# Patient Record
Sex: Female | Born: 1985 | Race: White | Hispanic: No | Smoking: Never smoker
Health system: Southern US, Community
[De-identification: ages and names within clinical notes are randomized; demographics above are authoritative.]

## PROBLEM LIST (undated history)

## (undated) DIAGNOSIS — I10 Essential (primary) hypertension: Secondary | ICD-10-CM

## (undated) DIAGNOSIS — I499 Cardiac arrhythmia, unspecified: Secondary | ICD-10-CM

## (undated) HISTORY — PX: WISDOM TOOTH EXTRACTION: SHX21

## (undated) HISTORY — PX: FACIAL COSMETIC SURGERY: SHX629

## (undated) HISTORY — PX: TUBAL LIGATION: SHX77

## (undated) HISTORY — PX: DILATION AND EVACUATION: SHX1459

---

## 2013-08-29 ENCOUNTER — Encounter (HOSPITAL_COMMUNITY): Payer: Self-pay | Admitting: Emergency Medicine

## 2013-08-29 ENCOUNTER — Emergency Department (HOSPITAL_COMMUNITY): Payer: Medicaid Other

## 2013-08-29 ENCOUNTER — Emergency Department (HOSPITAL_COMMUNITY)
Admission: EM | Admit: 2013-08-29 | Discharge: 2013-08-29 | Disposition: A | Discharge status: Home / Self Care | Payer: Medicaid Other | Attending: Emergency Medicine | Admitting: Emergency Medicine

## 2013-08-29 DIAGNOSIS — L0201 Cutaneous abscess of face: Secondary | ICD-10-CM | POA: Insufficient documentation

## 2013-08-29 DIAGNOSIS — Z3202 Encounter for pregnancy test, result negative: Secondary | ICD-10-CM | POA: Insufficient documentation

## 2013-08-29 DIAGNOSIS — L03211 Cellulitis of face: Secondary | ICD-10-CM

## 2013-08-29 DIAGNOSIS — Z8679 Personal history of other diseases of the circulatory system: Secondary | ICD-10-CM | POA: Insufficient documentation

## 2013-08-29 HISTORY — DX: Cardiac arrhythmia, unspecified: I49.9

## 2013-08-29 LAB — POC URINE PREG, ED: Preg Test, Ur: NEGATIVE

## 2013-08-29 MED ORDER — OXYCODONE-ACETAMINOPHEN 5-325 MG PO TABS: 1.0000 | ORAL_TABLET | ORAL | Status: DC | PRN

## 2013-08-29 MED ORDER — FLUCONAZOLE 200 MG PO TABS: 200.0000 mg | ORAL_TABLET | Freq: Every day | ORAL | Status: AC

## 2013-08-29 MED ORDER — CLINDAMYCIN HCL 150 MG PO CAPS: 150.0000 mg | ORAL_CAPSULE | Freq: Four times a day (QID) | ORAL | Status: AC

## 2013-08-29 MED ORDER — CLINDAMYCIN PHOSPHATE 900 MG/50ML IV SOLN
900.0000 mg | Freq: Once | INTRAVENOUS | Status: AC
  Administered 2013-08-29: 900 mg via INTRAVENOUS
  Filled 2013-08-29: qty 50

## 2013-08-29 MED ORDER — KETOROLAC TROMETHAMINE 30 MG/ML IJ SOLN
30.0000 mg | Freq: Once | INTRAMUSCULAR | Status: AC
  Administered 2013-08-29: 30 mg via INTRAVENOUS
  Filled 2013-08-29: qty 1

## 2013-08-29 MED ORDER — IOHEXOL 300 MG/ML  SOLN
80.0000 mL | Freq: Once | INTRAMUSCULAR | Status: AC | PRN
  Administered 2013-08-29: 80 mL via INTRAVENOUS

## 2013-08-29 NOTE — ED Provider Notes (Signed)
CSN: [id number]     Arrival date & time 08/29/13  1802 History   First MD Initiated Contact with Patient 08/29/13 1942     Chief Complaint  Patient presents with  . Facial Swelling     (Consider location/radiation/quality/duration/timing/severity/associated sxs/prior Treatment) HPI Comments: Patient here with right mandibular facial swelling for the past day.  She states that she has not had any dental pain but called her dentist today because she thought this might be it - he suggested she come to the ED.  She reports pain to the swollen area but no drainage, fever, chills, she states that the pain radiates up to her right ear - she states that she has taken a percocet that she had at home without relief of the pain.  She describes the pain as throbbing in nature.  The history is provided by the patient. No language interpreter was used.    Past Medical History  Diagnosis Date  . Irregular heart beat    Past Surgical History  Procedure Laterality Date  . Cesarean section    . Wisdom tooth extraction    . Dilation and evacuation    . Facial cosmetic surgery    . Tubal ligation     No family history on file. History  Substance Use Topics  . Smoking status: Never Smoker   . Smokeless tobacco: Not on file  . Alcohol Use: Yes     Comment: occasional   OB History   Grav Para Term Preterm Abortions TAB SAB Ect Mult Living                 Review of Systems  HENT: Positive for facial swelling. Negative for dental problem and mouth sores.   All other systems reviewed and are negative.     Allergies  Review of patient's allergies indicates no known allergies.  Home Medications   Prior to Admission medications   Medication Sig Start Date End Date Taking? Authorizing Provider  acetaminophen (TYLENOL) 500 MG tablet Take 1,000 mg by mouth every 6 (six) hours as needed for mild pain or headache.   Yes Historical Provider, MD  ibuprofen (ADVIL,MOTRIN) 800 MG tablet Take 800 mg  by mouth every 8 (eight) hours as needed for fever, headache or moderate pain.   Yes Historical Provider, MD   BP 129/79  Pulse 72  Temp(Src) 98.5 F (36.9 C) (Oral)  Resp 18  SpO2 100%  LMP 08/14/2013 Physical Exam  Nursing note and vitals reviewed. Constitutional: She is oriented to person, place, and time. She appears well-developed and well-nourished. No distress.  HENT:  Head: Atraumatic.  Right Ear: External ear normal.  Left Ear: External ear normal.  Nose: Nose normal.  Mouth/Throat: Oropharynx is clear and moist. No oropharyngeal exudate.  4cm x 5 cm area of induration to right mandibular area without extension beyond the angle of the mandible.  No sublingual swelling, no dental pain.  Eyes: Conjunctivae are normal. Pupils are equal, round, and reactive to light. No scleral icterus.  Neck: Normal range of motion. Neck supple.  Cardiovascular: Normal rate, regular rhythm and normal heart sounds.  Exam reveals no gallop and no friction rub.   No murmur heard. Pulmonary/Chest: Effort normal and breath sounds normal. No respiratory distress. She has no wheezes. She has no rales. She exhibits no tenderness.  Abdominal: Soft. Bowel sounds are normal. She exhibits no distension. There is no tenderness. There is no rebound and no guarding.  Musculoskeletal: Normal range of  motion. She exhibits no edema and no tenderness.  Lymphadenopathy:    She has no cervical adenopathy.  Neurological: She is alert and oriented to person, place, and time. She exhibits normal muscle tone. Coordination normal.  Skin: Skin is warm and dry. No rash noted. No erythema. No pallor.  Psychiatric: She has a normal mood and affect. Her behavior is normal. Judgment and thought content normal.    ED Course  Procedures (including critical care time) Labs Review Labs Reviewed  POC URINE PREG, ED    Imaging Review Ct Maxillofacial W/cm  08/29/2013   CLINICAL DATA:  Right-sided facial swelling  EXAM: CT  MAXILLOFACIAL WITH CONTRAST  TECHNIQUE: Multidetector CT imaging of the maxillofacial structures was performed with intravenous contrast. Multiplanar CT image reconstructions were also generated. A small metallic BB was placed on the right temple in order to reliably differentiate right from left.  CONTRAST:  80mL OMNIPAQUE IOHEXOL 300 MG/ML  SOLN  COMPARISON:  None.  FINDINGS: Considerable soft tissue swelling is noted in the right face along the cheek and mandible. No focal fluid collection is identified to suggest focal abscess formation. The orbits and their contents are within normal limits. No other focal abnormality is seen. The visualized intracranial structures are unremarkable.  IMPRESSION: Soft tissue swelling in the right cheek and jaw without focal abscess formation. These changes would be of most consistent with cellulitis.  No bony abnormality is seen.   Electronically Signed   By: Alcide CleverMark  Lukens M.D.   On: 08/29/2013 21:12     EKG Interpretation None      Medications  clindamycin (CLEOCIN) IVPB 900 mg (0 mg Intravenous Stopped 08/29/13 2217)  iohexol (OMNIPAQUE) 300 MG/ML solution 80 mL (80 mLs Intravenous Contrast Given 08/29/13 2107)  ketorolac (TORADOL) 30 MG/ML injection 30 mg (30 mg Intravenous Given 08/29/13 2217)     MDM   Facial cellulitis  Patient here with right mandibular cellulitis - no abscess noted on CT scan - patient given IV dose of clindamycin and will place on this for the infection.  Patient to follow up with PCP.   Izola PriceFrances C. Marisue HumbleSanford, PA-C 08/29/13 2227

## 2013-08-29 NOTE — ED Notes (Signed)
Pt states that the swelling began around 3am and has progressively gotten worse. Pt states that she has taken Tylenol, Ibuprofen and Percocet at home with no relief.

## 2013-08-29 NOTE — Progress Notes (Signed)
  CARE MANAGEMENT ED NOTE 08/29/2013  Patient:  Samantha Choi,Samantha Choi   Account Number:  [id number]  Date Initiated:  08/29/2013  Documentation initiated by:  Radford PaxFERRERO,Darlene Bartelt  Subjective/Objective Assessment:   Patient presented to Ed with facial swelling     Subjective/Objective Assessment Detail:     Action/Plan:   Action/Plan Detail:   Anticipated DC Date:       Status Recommendation to Physician:   Result of Recommendation:    Other ED Services  Consult Working Plan    DC Planning Services  Other  PCP issues    Choice offered to / List presented to:            Status of service:  Completed, signed off  ED Comments:   ED Comments Detail:  EDCM spoke to patient at bedside.  Patient is aware she has [employer].  Patient reports she will call them to try to have it changed to regular Medicaid.  EDCM offered patient phone number to DSS, patient refused. Patient [facility] [address].  Patient reports she doe snot see anyone there in particular.  Sytem updated.  No further EDCM needs at this time.

## 2013-08-29 NOTE — ED Notes (Signed)
Pt states she woke up this morning with the R side of her face swollen. States she feels no dental involvement. Airway intact. Pt states it feels like a bump that got bigger. Alert and oriented.

## 2013-08-29 NOTE — Discharge Instructions (Signed)
Cellulitis Cellulitis is an infection of the skin and the tissue beneath it. The infected area is usually red and tender. Cellulitis occurs most often in the arms and lower legs.  CAUSES  Cellulitis is caused by bacteria that enter the skin through cracks or cuts in the skin. The most common types of bacteria that cause cellulitis are Staphylococcus and Streptococcus. SYMPTOMS   Redness and warmth.  Swelling.  Tenderness or pain.  Fever. DIAGNOSIS  Your caregiver can usually determine what is wrong based on a physical exam. Blood tests may also be done. TREATMENT  Treatment usually involves taking an antibiotic medicine. HOME CARE INSTRUCTIONS   Take your antibiotics as directed. Finish them even if you start to feel better.  Keep the infected arm or leg elevated to reduce swelling.  Apply a warm cloth to the affected area up to 4 times per day to relieve pain.  Only take over-the-counter or prescription medicines for pain, discomfort, or fever as directed by your caregiver.  Keep all follow-up appointments as directed by your caregiver. SEEK MEDICAL CARE IF:   You notice red streaks coming from the infected area.  Your red area gets larger or turns dark in color.  Your bone or joint underneath the infected area becomes painful after the skin has healed.  Your infection returns in the same area or another area.  You notice a swollen bump in the infected area.  You develop new symptoms. SEEK IMMEDIATE MEDICAL CARE IF:   You have a fever.  You feel very sleepy.  You develop vomiting or diarrhea.  You have a general ill feeling (malaise) with muscle aches and pains. MAKE SURE YOU:   Understand these instructions.  Will watch your condition.  Will get help right away if you are not doing well or get worse. Document Released: 01/15/2005 Document Revised: 10/07/2011 Document Reviewed: 06/23/2011 [facility] Patient Information 2014 [place], [place].  Facial  Infection You have an infection of your face. This requires special attention to help prevent serious problems. Infections in facial wounds can cause poor healing and scars. They can also spread to deeper tissues, especially around the eye. Wound and dental infections can lead to sinusitis, infection of the eye socket, and even meningitis. Permanent damage to the skin, eye, and nervous system may result if facial infections are not treated properly. With severe infections, hospital care for IV antibiotic injections may be needed if they don't respond to oral antibiotics. Antibiotics must be taken for the full course to insure the infection is eliminated. If the infection came from a bad tooth, it may have to be extracted when the infection is under control. Warm compresses may be applied to reduce skin irritation and remove drainage. You might need a tetanus shot now if:  You cannot remember when your last tetanus shot was.  You have never had a tetanus shot.  The object that caused your wound was dirty. If you need a tetanus shot, and you decide not to get one, there is a rare chance of getting tetanus. Sickness from tetanus can be serious. If you got a tetanus shot, your arm may swell, get red and warm to the touch at the shot site. This is common and not a problem. SEEK IMMEDIATE MEDICAL CARE IF:   You have increased swelling, redness, or trouble breathing.  You have a severe headache, dizziness, nausea, or vomiting.  You develop problems with your eyesight.  You have a fever. Document Released: 05/15/2004 Document Revised:  06/30/2011 Document Reviewed: 04/07/2005 [employer] Patient Information 2014 [place], [place].

## 2013-08-31 NOTE — ED Provider Notes (Signed)
Medical screening examination/treatment/procedure(s) were performed by non-physician practitioner and as supervising physician I was immediately available for consultation/collaboration.   EKG Interpretation None       Aarohi Redditt M Makenna Macaluso, MD 08/31/13 2124 

## 2014-09-18 ENCOUNTER — Encounter (HOSPITAL_COMMUNITY): Payer: Self-pay

## 2014-09-18 ENCOUNTER — Emergency Department (HOSPITAL_COMMUNITY)
Admission: EM | Admit: 2014-09-18 | Discharge: 2014-09-18 | Disposition: A | Discharge status: Home / Self Care | Payer: Self-pay | Attending: Emergency Medicine | Admitting: Emergency Medicine

## 2014-09-18 ENCOUNTER — Emergency Department (HOSPITAL_COMMUNITY): Payer: Self-pay

## 2014-09-18 ENCOUNTER — Emergency Department (HOSPITAL_COMMUNITY): Payer: Medicaid Other

## 2014-09-18 DIAGNOSIS — Z79899 Other long term (current) drug therapy: Secondary | ICD-10-CM | POA: Insufficient documentation

## 2014-09-18 DIAGNOSIS — R071 Chest pain on breathing: Secondary | ICD-10-CM

## 2014-09-18 DIAGNOSIS — N390 Urinary tract infection, site not specified: Secondary | ICD-10-CM | POA: Insufficient documentation

## 2014-09-18 DIAGNOSIS — I10 Essential (primary) hypertension: Secondary | ICD-10-CM | POA: Insufficient documentation

## 2014-09-18 DIAGNOSIS — K279 Peptic ulcer, site unspecified, unspecified as acute or chronic, without hemorrhage or perforation: Secondary | ICD-10-CM | POA: Insufficient documentation

## 2014-09-18 DIAGNOSIS — R0789 Other chest pain: Secondary | ICD-10-CM | POA: Insufficient documentation

## 2014-09-18 DIAGNOSIS — R079 Chest pain, unspecified: Secondary | ICD-10-CM

## 2014-09-18 DIAGNOSIS — Z9851 Tubal ligation status: Secondary | ICD-10-CM | POA: Insufficient documentation

## 2014-09-18 DIAGNOSIS — Z3202 Encounter for pregnancy test, result negative: Secondary | ICD-10-CM | POA: Insufficient documentation

## 2014-09-18 DIAGNOSIS — R0602 Shortness of breath: Secondary | ICD-10-CM | POA: Insufficient documentation

## 2014-09-18 DIAGNOSIS — Z9889 Other specified postprocedural states: Secondary | ICD-10-CM | POA: Insufficient documentation

## 2014-09-18 HISTORY — DX: Essential (primary) hypertension: I10

## 2014-09-18 LAB — CBC WITH DIFFERENTIAL/PLATELET
BASOS ABS: 0 10*3/uL (ref 0.0–0.1)
Basophils Relative: 0 % (ref 0–1)
EOS PCT: 2 % (ref 0–5)
Eosinophils Absolute: 0.1 10*3/uL (ref 0.0–0.7)
HCT: 42.7 % (ref 36.0–46.0)
Hemoglobin: 13.8 g/dL (ref 12.0–15.0)
Lymphocytes Relative: 40 % (ref 12–46)
Lymphs Abs: 2.7 10*3/uL (ref 0.7–4.0)
MCH: 26.8 pg (ref 26.0–34.0)
MCHC: 32.3 g/dL (ref 30.0–36.0)
MCV: 82.9 fL (ref 78.0–100.0)
Monocytes Absolute: 0.6 10*3/uL (ref 0.1–1.0)
Monocytes Relative: 9 % (ref 3–12)
NEUTROS ABS: 3.4 10*3/uL (ref 1.7–7.7)
NEUTROS PCT: 49 % (ref 43–77)
Platelets: 212 10*3/uL (ref 150–400)
RBC: 5.15 MIL/uL — ABNORMAL HIGH (ref 3.87–5.11)
RDW: 12.5 % (ref 11.5–15.5)
WBC: 6.9 10*3/uL (ref 4.0–10.5)

## 2014-09-18 LAB — COMPREHENSIVE METABOLIC PANEL
ALT: 11 U/L — AB (ref 14–54)
AST: 14 U/L — ABNORMAL LOW (ref 15–41)
Albumin: 3.9 g/dL (ref 3.5–5.0)
Alkaline Phosphatase: 81 U/L (ref 38–126)
Anion gap: 8 (ref 5–15)
BILIRUBIN TOTAL: 0.6 mg/dL (ref 0.3–1.2)
BUN: 11 mg/dL (ref 6–20)
CALCIUM: 8.9 mg/dL (ref 8.9–10.3)
CHLORIDE: 106 mmol/L (ref 101–111)
CO2: 26 mmol/L (ref 22–32)
CREATININE: 0.84 mg/dL (ref 0.44–1.00)
GFR calc Af Amer: >60 mL/min (ref >60)
GFR calc non Af Amer: >60 mL/min (ref >60)
GLUCOSE: 87 mg/dL (ref 65–99)
Potassium: 3.8 mmol/L (ref 3.5–5.1)
SODIUM: 140 mmol/L (ref 135–145)
Total Protein: 7.5 g/dL (ref 6.5–8.1)

## 2014-09-18 LAB — URINALYSIS, ROUTINE W REFLEX MICROSCOPIC
Bilirubin Urine: NEGATIVE
Glucose, UA: NEGATIVE mg/dL
KETONES UR: NEGATIVE mg/dL
Nitrite: POSITIVE — AB
PH: 7.5 (ref 5.0–8.0)
Protein, ur: NEGATIVE mg/dL
Specific Gravity, Urine: 1.023 (ref 1.005–1.030)
UROBILINOGEN UA: 1 mg/dL (ref 0.0–1.0)

## 2014-09-18 LAB — URINE MICROSCOPIC-ADD ON

## 2014-09-18 LAB — I-STAT TROPONIN, ED: TROPONIN I, POC: 0 ng/mL (ref 0.00–0.08)

## 2014-09-18 LAB — LIPASE, BLOOD: Lipase: 19 U/L — ABNORMAL LOW (ref 22–51)

## 2014-09-18 LAB — PREGNANCY, URINE: Preg Test, Ur: NEGATIVE

## 2014-09-18 MED ORDER — OMEPRAZOLE 40 MG PO CPDR: 40.0000 mg | DELAYED_RELEASE_CAPSULE | Freq: Every day | ORAL | Status: AC

## 2014-09-18 MED ORDER — HYDROMORPHONE HCL 1 MG/ML IJ SOLN
0.5000 mg | Freq: Once | INTRAMUSCULAR | Status: AC
  Administered 2014-09-18: 0.5 mg via INTRAVENOUS
  Filled 2014-09-18: qty 1

## 2014-09-18 MED ORDER — NITROFURANTOIN MONOHYD MACRO 100 MG PO CAPS: 100.0000 mg | ORAL_CAPSULE | Freq: Two times a day (BID) | ORAL | Status: AC

## 2014-09-18 MED ORDER — GI COCKTAIL ~~LOC~~
30.0000 mL | Freq: Once | ORAL | Status: AC
  Administered 2014-09-18: 30 mL via ORAL
  Filled 2014-09-18: qty 30

## 2014-09-18 MED ORDER — OXYCODONE-ACETAMINOPHEN 5-325 MG PO TABS: 1.0000 | ORAL_TABLET | Freq: Four times a day (QID) | ORAL | Status: AC | PRN

## 2014-09-18 MED ORDER — ONDANSETRON HCL 4 MG PO TABS: 4.0000 mg | ORAL_TABLET | Freq: Three times a day (TID) | ORAL | Status: AC | PRN

## 2014-09-18 MED ORDER — PANTOPRAZOLE SODIUM 40 MG IV SOLR
40.0000 mg | Freq: Once | INTRAVENOUS | Status: AC
  Administered 2014-09-18: 40 mg via INTRAVENOUS
  Filled 2014-09-18: qty 40

## 2014-09-18 MED ORDER — SODIUM CHLORIDE 0.9 % IV BOLUS (SEPSIS)
1000.0000 mL | Freq: Once | INTRAVENOUS | Status: AC
  Administered 2014-09-18: 1000 mL via INTRAVENOUS

## 2014-09-18 NOTE — Discharge Instructions (Signed)
Peptic Ulcer °A peptic ulcer is a sore in the lining of your esophagus (esophageal ulcer), stomach (gastric ulcer), or in the first part of your small intestine (duodenal ulcer). The ulcer causes erosion into the deeper tissue. °CAUSES  °Normally, the lining of the stomach and the small intestine protects itself from the acid that digests food. The protective lining can be damaged by: °· An infection caused by a bacterium called Helicobacter pylori (H. pylori). °· Regular use of nonsteroidal anti-inflammatory drugs (NSAIDs), such as ibuprofen or aspirin. °· Smoking tobacco. °Other risk factors include being older than 50, drinking alcohol excessively, and having a family history of ulcer disease.  °SYMPTOMS  °· Burning pain or gnawing in the area between the chest and the belly button. °· Heartburn. °· Nausea and vomiting. °· Bloating. °The pain can be worse on an empty stomach and at night. If the ulcer results in bleeding, it can cause: °· Black, tarry stools. °· Vomiting of bright red blood. °· Vomiting of coffee-ground-looking materials. °DIAGNOSIS  °A diagnosis is usually made based upon your history and an exam. Other tests and procedures may be performed to find the cause of the ulcer. Finding a cause will help determine the best treatment. Tests and procedures may include: °· Blood tests, stool tests, or breath tests to check for the bacterium H. pylori. °· An upper gastrointestinal (GI) series of the esophagus, stomach, and small intestine. °· An endoscopy to examine the esophagus, stomach, and small intestine. °· A biopsy. °TREATMENT  °Treatment may include: °· Eliminating the cause of the ulcer, such as smoking, NSAIDs, or alcohol. °· Medicines to reduce the amount of acid in your digestive tract. °· Antibiotic medicines if the ulcer is caused by the H. pylori bacterium. °· An upper endoscopy to treat a bleeding ulcer. °· Surgery if the bleeding is severe or if the ulcer created a hole somewhere in the  digestive system. °HOME CARE INSTRUCTIONS  °· Avoid tobacco, alcohol, and caffeine. Smoking can increase the acid in the stomach, and continued smoking will impair the healing of ulcers. °· Avoid foods and drinks that seem to cause discomfort or aggravate your ulcer. °· Only take medicines as directed by your caregiver. Do not substitute over-the-counter medicines for prescription medicines without talking to your caregiver. °· Keep any follow-up appointments and tests as directed. °SEEK MEDICAL CARE IF:  °· Your do not improve within 7 days of starting treatment. °· You have ongoing indigestion or heartburn. °SEEK IMMEDIATE MEDICAL CARE IF:  °· You have sudden, sharp, or persistent abdominal pain. °· You have bloody or dark black, tarry stools. °· You vomit blood or vomit that looks like coffee grounds. °· You become light-headed, weak, or feel faint. °· You become sweaty or clammy. °MAKE SURE YOU:  °· Understand these instructions. °· Will watch your condition. °· Will get help right away if you are not doing well or get worse. °Document Released: 04/04/2000 Document Revised: 08/22/2013 Document Reviewed: 11/05/2011 °ExitCare® Patient Information ©2015 ExitCare, LLC. This information is not intended to replace advice given to you by your health care provider. Make sure you discuss any questions you have with your health care provider. ° °Food Choices for Peptic Ulcer Disease °When you have peptic ulcer disease, the foods you eat and your eating habits are very important. Choosing the right foods can help ease the discomfort of peptic ulcer disease. °WHAT GENERAL GUIDELINES DO I NEED TO FOLLOW? °· Choose fruits, vegetables, whole grains, and low-fat meat,   fish, and poultry.   Keep a food diary to identify foods that cause symptoms.  Avoid foods that cause irritation or pain. These may be different for different people.  Eat frequent small meals instead of three large meals each day. The pain may be worse  when your stomach is empty.  Avoid eating close to bedtime. WHAT FOODS ARE NOT RECOMMENDED? The following are some foods and drinks that may worsen your symptoms:  Black, white, and red pepper.  Hot sauce.  Chili peppers.  Chili powder.  Chocolate and cocoa.   Alcohol.  Tea, coffee, and cola (regular and decaffeinated). The items listed above may not be a complete list of foods and beverages to avoid. Contact your dietitian for more information. Document Released: 06/30/2011 Document Revised: 04/12/2013 Document Reviewed: 02/09/2013 [facility] Patient Information 2015 [place]. This information is not intended to replace advice given to you by your health care provider. Make sure you discuss any questions you have with your health care provider.  Urinary Tract Infection Urinary tract infections (UTIs) can develop anywhere along your urinary tract. Your urinary tract is your body's drainage system for removing wastes and extra water. Your urinary tract includes two kidneys, two ureters, a bladder, and a urethra. Your kidneys are a pair of bean-shaped organs. Each kidney is about the size of your fist. They are located below your ribs, one on each side of your spine. CAUSES Infections are caused by microbes, which are microscopic organisms, including fungi, viruses, and bacteria. These organisms are so small that they can only be seen through a microscope. Bacteria are the microbes that most commonly cause UTIs. SYMPTOMS  Symptoms of UTIs may vary by age and gender of the patient and by the location of the infection. Symptoms in young women typically include a frequent and intense urge to urinate and a painful, burning feeling in the bladder or urethra during urination. Older women and men are more likely to be tired, shaky, and weak and have muscle aches and abdominal pain. A fever may mean the infection is in your kidneys. Other symptoms of a kidney infection include pain in your  back or sides below the ribs, nausea, and vomiting. DIAGNOSIS To diagnose a UTI, your caregiver will ask you about your symptoms. Your caregiver also will ask to provide a urine sample. The urine sample will be tested for bacteria and white blood cells. White blood cells are made by your body to help fight infection. TREATMENT  Typically, UTIs can be treated with medication. Because most UTIs are caused by a bacterial infection, they usually can be treated with the use of antibiotics. The choice of antibiotic and length of treatment depend on your symptoms and the type of bacteria causing your infection. HOME CARE INSTRUCTIONS  If you were prescribed antibiotics, take them exactly as your caregiver instructs you. Finish the medication even if you feel better after you have only taken some of the medication.  Drink enough water and fluids to keep your urine clear or pale yellow.  Avoid caffeine, tea, and carbonated beverages. They tend to irritate your bladder.  Empty your bladder often. Avoid holding urine for long periods of time.  Empty your bladder before and after sexual intercourse.  After a bowel movement, women should cleanse from front to back. Use each tissue only once. SEEK MEDICAL CARE IF:   You have back pain.  You develop a fever.  Your symptoms do not begin to resolve within 3 days. SEEK  IMMEDIATE MEDICAL CARE IF:   You have severe back pain or lower abdominal pain.  You develop chills.  You have nausea or vomiting.  You have continued burning or discomfort with urination. MAKE SURE YOU:   Understand these instructions.  Will watch your condition.  Will get help right away if you are not doing well or get worse. Document Released: 01/15/2005 Document Revised: 10/07/2011 Document Reviewed: 05/16/2011 [facility] Patient Information 2015 [place], [place]. This information is not intended to replace advice given to you by your health care provider. Make sure you  discuss any questions you have with your health care provider.   Emergency Department Resource Guide 1) Find a Doctor and Pay Out of Pocket Although you won't have to find out who is covered by your insurance plan, it is a good idea to ask around and get recommendations. You will then need to call the office and see if the doctor you have chosen will accept you as a new patient and what types of options they offer for patients who are self-pay. Some doctors offer discounts or will set up payment plans for their patients who do not have insurance, but you will need to ask so you aren't surprised when you get to your appointment.  2) Contact Your Local Health Department Not all health departments have doctors that can see patients for sick visits, but many do, so it is worth a call to see if yours does. If you don't know where your local health department is, you can check in your phone book. The CDC also has a tool to help you locate your state's health department, and many state websites also have listings of all of their local health departments.  3) Find a Walk-in Clinic If your illness is not likely to be very severe or complicated, you may want to try a walk in clinic. These are popping up all over the country in pharmacies, drugstores, and shopping centers. They're usually staffed by nurse practitioners or physician assistants that have been trained to treat common illnesses and complaints. They're usually fairly quick and inexpensive. However, if you have serious medical issues or chronic medical problems, these are probably not your best option.  No Primary Care Doctor: - Call Health Connect at  [phone] - they can help you locate a primary care doctor that  accepts your insurance, provides certain services, etc. - Physician Referral Service- [phone]  Chronic Pain Problems: Organization         Address  Phone   Notes  [facility]  [phone] Patients need to  be referred by their primary care doctor.   Medication Assistance: Organization         Address  Phone   Notes  [facility] [address] [place] [place], [place] [address] [phone] --Must be a resident of [facility] -- Must have NO insurance coverage whatsoever (no Medicaid/ Medicare, etc.) -- The pt. MUST have a primary care doctor that directs their care regularly and follows them in the community   [facility]ist  [phone]   [employer]  [phone]    Agencies that provide inexpensive medical care: Organization         Address  Phone   Notes  [facility]  [phone]   [facility]    [phone]   [facility] [address] [place], [place] [address] [phone]  [facility] [address], [place] [phone]   Planned Parenthood    [phone]   [facility]    [phone]   [facility]  [address], [place] Phone:  [phone], Fax:  [phone] Hours of Operation:  9 am - 6 pm, M-F.  Also accepts Medicaid/Medicare and self-pay.  [facility]  [address], [place] Phone: [phone], Fax: [phone]. Hours of Operation:  8:30 am - 5:30 pm, M-F.  Also accepts Medicaid and self-pay.  [facility] [place] [address] [place] Phone: [phone]   [facility] [address] Natasha Bence [place], [place] [phone], Ext. [phone] Mondays & Thursdays: 7-9 AM.  First 15 patients are seen on a first come, first serve basis.    Medicaid-accepting [facility] Providers:  Organization         Address  Phone   Notes  [facility] [address], [place] [phone] Also accepts self-pay patients.  [facility] [address] Laurell Josephs [place], [place]  [phone]   [facility] [address], [place] [phone]   [facility] [address] [place] [phone]   Renaye Rakers [address], [place]   [phone] Only accepts [place] patients after they have their name applied to their card.   Self-Pay (no insurance) in [facility]:  Organization         Address  Phone   Notes  Sickle Cell Patients, [facility] [address] [place], [place] [phone]   [facility] [address] [place], [place] [phone]   [facility] [place]  [address], [place] [phone]   [facility]/Dr. Osei-Bonsu  [address] [place] or [address], [place] [phone] Phone number for both [place] and [place] locations is the same.  [facility] [address], [place] [phone]   [facility] [address], [place] or [address] [phone]   [facility] [address], [place] [phone], phone; [phone], fax Sees patients 1st and 3rd Saturday of every month.  Must not qualify for public or private insurance (i.e. Medicaid, Medicare, [place] Health Choice, Veterans' Benefits)  Household income should be no more than 200% of the poverty level The clinic cannot treat you if you are pregnant or think you are pregnant  Sexually transmitted diseases are not treated at the clinic.    Dental Care: Organization         Address  Phone  Notes  [facility] [address] [place], [place] [phone] Accepts children up to age 62 who are enrolled in [place] or [place] Health Choice; pregnant women with a Medicaid card; and children who have applied for Medicaid or [place] Health Choice, but were declined, whose parents can  pay a reduced fee at time of service.  [facility]  [address], [employer] [facility] [phone] Accepts children up to age 98 who are enrolled in [place] or [place] Health Choice; pregnant women with a Medicaid card; and  children who have applied for Medicaid or [place] Health Choice, but were declined, whose parents can pay a reduced fee at time of service.  [facility]  [address] [place], [place] [phone] Patients are seen by appointment only. Walk-ins are not accepted. [facility] will see patients 31 years of age and older. Monday - Tuesday (8am-5pm) Most Wednesdays (8:30-5pm) $30 per visit, cash only  [facility] Adult Dental Access PRO[facility]  [address], [facility] [phone] Patients are seen by appointment only. Walk-ins are not accepted. [facility] will see patients 12 years of age and older. One Wednesday Evening (Monthly: Volunteer Based).  $30 per visit, cash only  [employer]  [phone] for adults; Children under age 73, call Graduate Pediatric Dentistry at [phone]. Children aged 59-14, please call [phone] to request a pediatric application.  Dental services are provided in all areas of dental care including fillings, crowns and bridges, complete and partial dentures, implants, gum treatment, root canals, and extractions. Preventive care is also provided. Treatment is provided to both adults and children. Patients are selected via a lottery and there is often a waiting list.   [facility] [address] [place]  [phone] [email or web address]   [facility] [address] [place], [place] [phone], Ext. [phone] Second and Fourth Thursday of each month, opens at 6:30 AM; Clinic ends at 9 AM.  Patients are seen on a first-come first-served basis, and a limited number are seen during each clinic.   [facility]  [address] Ether Griffins [place], [place] [phone]   Eligibility Requirements You must have lived in [place], [place], or [place] counties for at least the last three months.   You cannot be eligible for state or federal sponsored [place], including [employer], [place], or [employer].   You generally cannot be eligible for healthcare insurance through your employer.    How to apply: Eligibility screenings are held every Tuesday and Wednesday afternoon from 1:00 pm until 4:00 pm. You do not need an appointment for the interview!  [facility] [address] [place], [place] [phone]   [facility] Health Department  [phone]   [facility] Health Department  [phone]   [facility] Health Department  [phone]    Behavioral Health Resources in the Community: Intensive Outpatient Programs Organization         Address  Phone  Notes  [facility] Services [address] [place], [place] [phone]   [facility] [address] [place], [place] [phone]   [facility] [address] [place], [place]  [phone]   [facility] [address],  [place], [place] [phone] or [phone]   Substance Abuse Resources Organization         Address  Phone  Notes  [facility]  [phone]   [facility]  [phone]   [facility]  [phone]   Floydene Flock  [phone]   Residential & Outpatient Substance Abuse Program  [phone]   Psychological Services Organization         Address  Phone  Notes  [facility]  [phone]   [facility]  [phone]   [facility] Mental Health [address] [place] [phone] or [phone]    Mobile Crisis Teams Organization  Address  Phone  Notes  [facility], Mobile Crisis Care Unit  [phone]    [facility]  [address] [place], [place] [phone]   [facility] De[facility]sch [address] [place] [phone]    Self-Help/Support Groups Organization         Address  Phone             Notes  [facility] [place] - variety of support groups  [phone]- [phone] Call for more information  Narcotics Anonymous (NA), [facility] [address], [place] Reader  2 meetings at this location   Statisticianesidential Treatment Programs Organization         Address  Phone  Notes  [facility] [address],    [place]  [phone]   [facility]  [address], [place] [phone], [place], [place] [phone]   [facility] [address], [place] [phone] Admissions: 8am-3pm M-F  [facility] [address]    [place], [place] [phone]   [facility] [address], [place], [place] [phone]   [facility] [address]  [place], [place] [phone]   [facility] - Intensive Outpatient [address], [place], [place] [phone]   [facility] ([facility] [address]  [place], [place] [phone] or [phone]   [facility] ([facility]) [address] [place], [place] [phone] Accepts Medicaid  [facility] [address]  [place] [phone] Substance Abuse/Addiction Treatment   [facility] Resources Organization         Address  Phone  Notes  [facility]  [phone]) [phone]   Angie FavaJulie Brannon, PhD [address] [place], [place]   [phone] or [phone]   [facility]   [address] [place], [place] [phone]   [facility] [address], [place], [place] [phone]) [phone] Insurance/Medicaid/sponsorship through [facility]  [facility] [address]                                     [place], [place] [phone]) [phone] Therapy/tele-psych/case  [facility] [address]   [place], [place] [phone]    Dr. Lolly MustacheArfeen  [phone]) [phone]   [facility]  [employer] [facility] 1) [address], [place] 2) [address], [place] 3)  [address], [place] [phone]) [phone]) [phone]  [phone]) [phone]   [facility] Child Abuse Hotline [phone]) 3086653520 or [phone]) [phone] (After Hours)

## 2014-09-18 NOTE — ED Notes (Signed)
Patient transported to X-ray 

## 2014-09-18 NOTE — ED Provider Notes (Signed)
CSN: [id number]     Arrival date & time 09/18/14  1100 History   First MD Initiated Contact with Patient 09/18/14 1119     Chief Complaint  Patient presents with  . Chest Pain     (Consider location/radiation/quality/duration/timing/severity/associated sxs/prior Treatment) HPI Samantha Choi is a 29 year old female past medical history of hypertension who presents the ER complaining of chest discomfort. Patient reports since 2012 she has been experiencing intermittent chest discomfort, recently seen and this past January, 5 months ago for same. Patient was seen by cardiologist, seen by gastroenterologist, underwent EGD with diagnosis of H. pylori gastric ulcer. Patient states she is placed on several medications afterward, however is not compliant with these medications. Patient states she continues to get intermittent pains in her chest, which typically are tolerable. Patient reports having a pain since yesterday which has been intermittent. Last night she had the same pain which woke her from sleep. She states that this pain was consistent with pain she has been having since January, however was more severe. Patient reports associated nausea, shortness of breath. Patient states the pain has been intermittent since yesterday, and worsening. Patient states the pain comes for approximately 15 minutes of time, has no aggravating factors. Sitting up seems to have some alleviation of the pain. She denies having any exertional dyspnea or chest pain. She denies any dizziness, weakness, headache, blurred vision, palpitations, vomiting, fever.  Past Medical History  Diagnosis Date  . Irregular heart beat   . Hypertension    Past Surgical History  Procedure Laterality Date  . Cesarean section    . Wisdom tooth extraction    . Dilation and evacuation    . Facial cosmetic surgery    . Tubal ligation     History reviewed. No pertinent family history. History  Substance Use Topics  . Smoking  status: Never Smoker   . Smokeless tobacco: Not on file  . Alcohol Use: Yes     Comment: occasional   OB History    No data available     Review of Systems  Constitutional: Negative for fever.  HENT: Negative for trouble swallowing.   Eyes: Negative for visual disturbance.  Respiratory: Positive for shortness of breath. Negative for cough.   Cardiovascular: Negative for chest pain.  Gastrointestinal: Positive for nausea and abdominal pain. Negative for vomiting.  Genitourinary: Negative for dysuria.  Musculoskeletal: Negative for neck pain.  Skin: Negative for rash.  Neurological: Negative for dizziness, weakness and numbness.  Psychiatric/Behavioral: Negative.     Allergies  Review of patient's allergies indicates no known allergies.  Home Medications   Prior to Admission medications   Medication Sig Start Date End Date Taking? Authorizing Provider  acetaminophen (TYLENOL) 500 MG tablet Take 1,000 mg by mouth every 6 (six) hours as needed for mild pain or headache.   Yes Historical Provider, MD  ibuprofen (ADVIL,MOTRIN) 800 MG tablet Take 800 mg by mouth every 8 (eight) hours as needed for fever, headache or moderate pain.   Yes Historical Provider, MD  clindamycin (CLEOCIN) 150 MG capsule Take 1 capsule (150 mg total) by mouth every 6 (six) hours. Patient not taking: Reported on 09/18/2014 08/29/13   Cherrie Distance, PA-C  nitrofurantoin, macrocrystal-monohydrate, (MACROBID) 100 MG capsule Take 1 capsule (100 mg total) by mouth 2 (two) times daily. For 5 days. 09/18/14   Ladona Mow, PA-C  omeprazole (PRILOSEC) 40 MG capsule Take 1 capsule (40 mg total) by mouth daily. 09/18/14   Ladona Mow, PA-C  ondansetron (ZOFRAN) 4 MG tablet Take 1 tablet (4 mg total) by mouth every 8 (eight) hours as needed for nausea or vomiting. 09/18/14   Ladona Mow, PA-C  oxyCODONE-acetaminophen (PERCOCET) 5-325 MG per tablet Take 1-2 tablets by mouth every 6 (six) hours as needed. 09/18/14   Ladona Mow, PA-C    BP 102/59 mmHg  Pulse 62  Temp(Src) 98.2 F (36.8 C) (Oral)  Resp 16  SpO2 100%  LMP 09/17/2014 Physical Exam  Constitutional: She is oriented to person, place, and time. She appears well-developed and well-nourished. No distress.  HENT:  Head: Normocephalic and atraumatic.  Mouth/Throat: Oropharynx is clear and moist. No oropharyngeal exudate.  Eyes: Right eye exhibits no discharge. Left eye exhibits no discharge. No scleral icterus.  Neck: Normal range of motion.  Cardiovascular: Normal rate, regular rhythm and normal heart sounds.   No murmur heard. Pulmonary/Chest: Effort normal and breath sounds normal. No respiratory distress.  Abdominal: Soft. Normal appearance. There is tenderness in the epigastric area and left upper quadrant. There is no rigidity, no guarding, no tenderness at McBurney's point and negative Murphy's sign.  Musculoskeletal: Normal range of motion. She exhibits no edema or tenderness.  Neurological: She is alert and oriented to person, place, and time. No cranial nerve deficit. Coordination normal.  Skin: Skin is warm and dry. No rash noted. She is not diaphoretic.  Psychiatric: She has a normal mood and affect.  Nursing note and vitals reviewed.   ED Course  Procedures (including critical care time) Labs Review Labs Reviewed  CBC WITH DIFFERENTIAL/PLATELET - Abnormal; Notable for the following:    RBC 5.15 (*)    All other components within normal limits  COMPREHENSIVE METABOLIC PANEL - Abnormal; Notable for the following:    AST 14 (*)    ALT 11 (*)    All other components within normal limits  LIPASE, BLOOD - Abnormal; Notable for the following:    Lipase 19 (*)    All other components within normal limits  URINALYSIS, ROUTINE W REFLEX MICROSCOPIC (NOT AT [facility]) - Abnormal; Notable for the following:    APPearance CLOUDY (*)    Hgb urine dipstick SMALL (*)    Nitrite POSITIVE (*)    Leukocytes, UA SMALL (*)    All other components within  normal limits  URINE MICROSCOPIC-ADD ON - Abnormal; Notable for the following:    Bacteria, UA MANY (*)    All other components within normal limits  URINE CULTURE  PREGNANCY, URINE  I-STAT TROPOININ, ED    Imaging Review Dg Chest 2 View  09/18/2014   CLINICAL DATA:  Chest pain and shortness of breath for 1 day.  EXAM: CHEST  2 VIEW  COMPARISON:  PA and lateral chest 03/01/2013.  FINDINGS: Heart size and mediastinal contours are within normal limits. Both lungs are clear. Visualized skeletal structures are unremarkable.  IMPRESSION: Negative exam.   Electronically Signed   By: Drusilla Kanner M.D.   On: 09/18/2014 13:50     EKG Interpretation   Date/Time:  Monday Sep 18 2014 11:08:16 EDT Ventricular Rate:  65 PR Interval:  121 QRS Duration: 87 QT Interval:  421 QTC Calculation: 438 R Axis:   73 Text Interpretation:  Sinus rhythm No old tracing to compare Confirmed by  Mirian Mo [phone]) on 09/18/2014 2:50:08 PM      MDM   Final diagnoses:  Atypical chest pain  PUD (peptic ulcer disease)  UTI (lower urinary tract infection)    Patient is  to be discharged with recommendation to follow up with PCP and gastroenterologist in regards to today's hospital visit. Chest pain is not likely of cardiac or pulmonary etiology d/t presentation, perc negative, VSS, no tracheal deviation, no JVD or new murmur, RRR, breath sounds equal bilaterally, EKG without acute abnormalities, negative troponin, and negative CXR. HEART Score <3. Patient's pain is reproducible in her upper abdomen, states her pain is consistent with pain she has been experiencing for the past 6 months after being diagnosed with peptic ulcer. Patient states she has been noncompliant with peptic ulcer disease regimen she was placed on by her gastroenterologist. Pt has been advised start a PPI and return to the ED is CP becomes exertional, associated with diaphoresis or nausea, radiates to left jaw/arm, worsens or becomes  concerning in any way. Pt appears reliable for follow up and is agreeable to discharge. Patient was also noted to have a urinary tract infection. After questioning patient again she does endorse having some burning sensation which urinates some mild frequency. Return precautions discussed, patient verbalizes understanding and agreement of this plan. .  BP 102/59 mmHg  Pulse 62  Temp(Src) 98.2 F (36.8 C) (Oral)  Resp 16  SpO2 100%  LMP 09/17/2014  Signed,  Ladona MowJoe Mintz, PA-C 4:43 PM  Patient discussed with Dr. Mirian MoMatthew Gentry, MD    Ladona MowJoe Mintz, PA-C 09/18/14 1643  Medical screening examination/treatment/procedure(s) were performed by non-physician practitioner and as supervising physician I was immediately available for consultation/collaboration.   EKG Interpretation   Date/Time:  Monday Sep 18 2014 11:08:16 EDT Ventricular Rate:  65 PR Interval:  121 QRS Duration: 87 QT Interval:  421 QTC Calculation: 438 R Axis:   73 Text Interpretation:  Sinus rhythm No old tracing to compare Confirmed by  Mirian MoGentry, Matthew [phone]) on 09/18/2014 2:50:08 PM        Mirian MoMatthew Gentry, MD 09/22/14 1739

## 2014-09-18 NOTE — ED Notes (Signed)
Pt c/o central chest pain radiating into upper back, SOB, and nausea x 1 day.  Pain score 8/10.  Pt reports Hx of same and Cardiologist placed her on medication.  Sts "I haven't taken it in a long time, because it's expensive."  Hx of HTN.

## 2014-09-18 NOTE — ED Notes (Signed)
Nurse currently drawing labs 

## 2014-09-20 LAB — URINE CULTURE

## 2014-09-21 ENCOUNTER — Telehealth (HOSPITAL_BASED_OUTPATIENT_CLINIC_OR_DEPARTMENT_OTHER): Payer: Self-pay | Admitting: Emergency Medicine

## 2014-09-21 NOTE — Telephone Encounter (Signed)
Post ED Visit - Positive Culture Follow-up  Culture report reviewed by antimicrobial stewardship pharmacist: []  Wes Dulaney, Pharm.D., BCPS []  Celedonio MiyamotoJeremy Frens, Pharm.D., BCPS [x]  Georgina PillionElizabeth Martin, Pharm.D., BCPS []  AllisonMinh Pham, [place]arm[place]D., BCPS, AAHIVP []  Estella HuskMichelle Turner, Pharm.D., BCPS, AAHIVP []  Elder CyphersLorie Poole, [address]Pharm[address]D., BCPS  Positive urine  Culture Klebsiella Treated with nitrofurantoin, organism sensitive to the same and no further patient follow-up is required at this time.  Berle MullMiller, [place] Dorgan 09/21/2014, 1:30 PM

## 2015-05-12 IMAGING — CT CT MAXILLOFACIAL W/ CM
1 series · 16 of 30 positions shown, 20 images · IV contrast (omnipaque)
Comparison: None.

CLINICAL DATA: Right-sided facial swelling

EXAM:
CT MAXILLOFACIAL WITH CONTRAST
TECHNIQUE: Multidetector CT imaging of the maxillofacial structures was
performed with intravenous contrast. Multiplanar CT image
reconstructions were also generated. A small metallic BB was placed
on the right temple in order to reliably differentiate right from
left.
CONTRAST:  80mL OMNIPAQUE IOHEXOL 300 MG/ML  SOLN

[Series 3: facial st · axial · 0.33mm/px · z∈[-342,-188]mm · 16 of 83 slices shown, 20 images]
[im 3/83  brain]
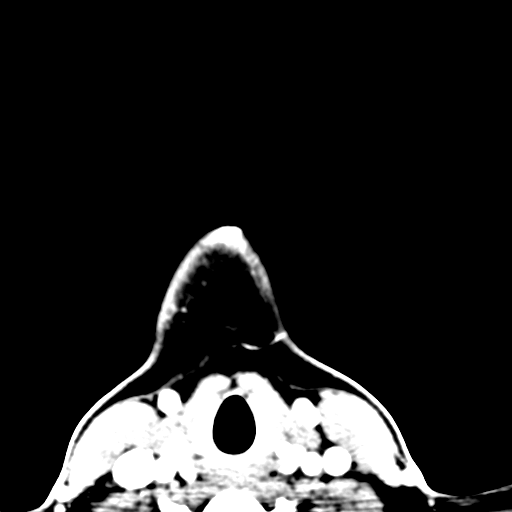
[im 3/83  bone]
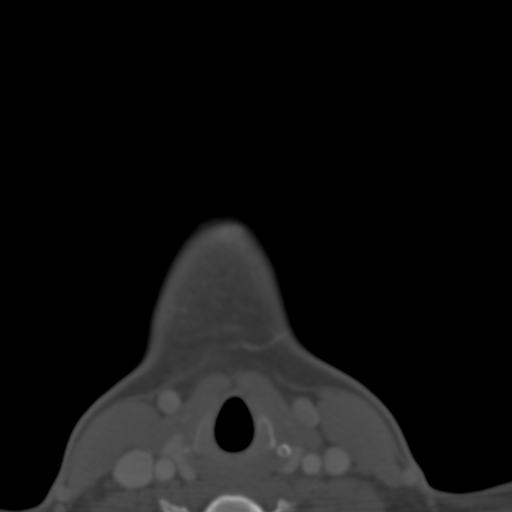
[im 9/83  bone]
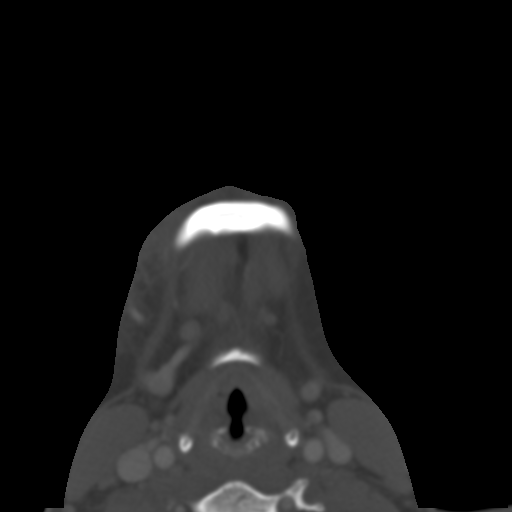
[im 15/83  bone]
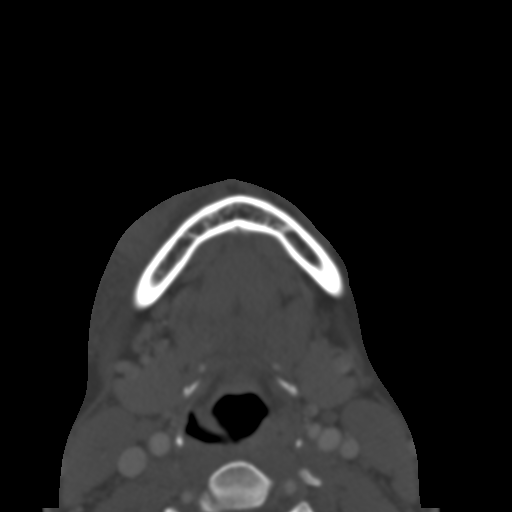
[im 20/83  bone]
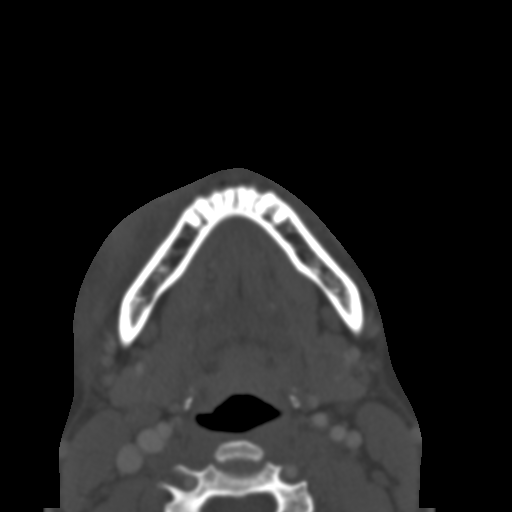
[im 23/83  brain]
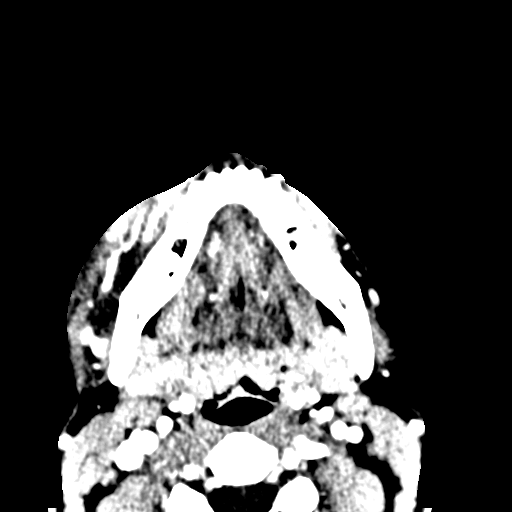
[im 23/83  bone]
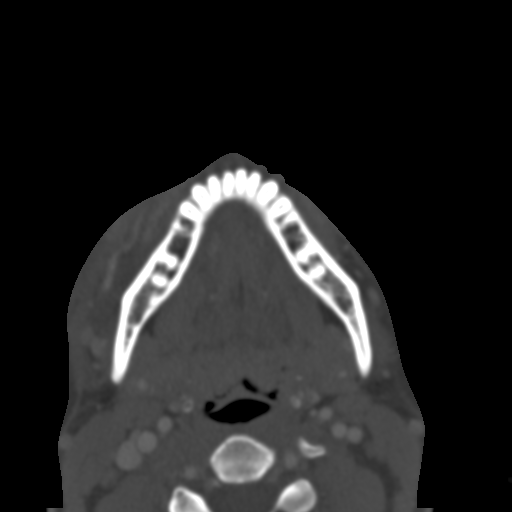
[im 29/83  bone]
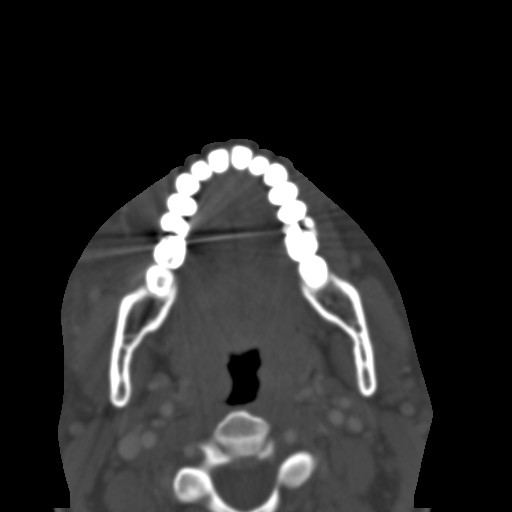
[im 34/83  bone]
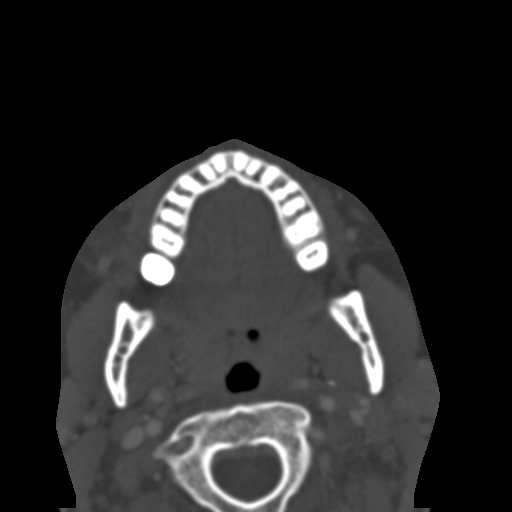
[im 40/83  bone]
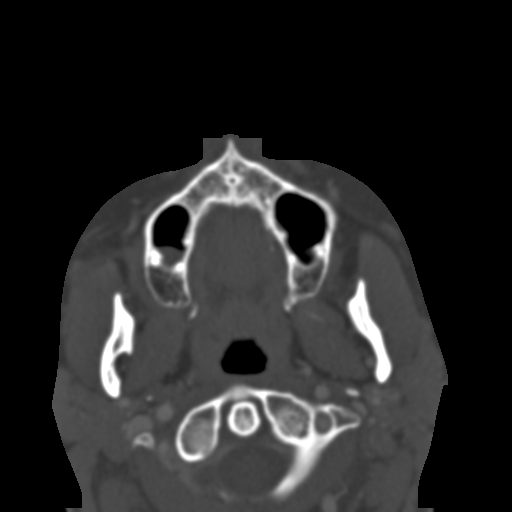
[im 43/83  brain]
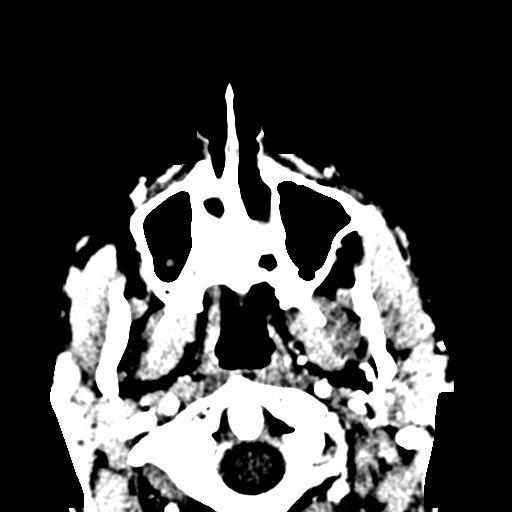
[im 43/83  bone]
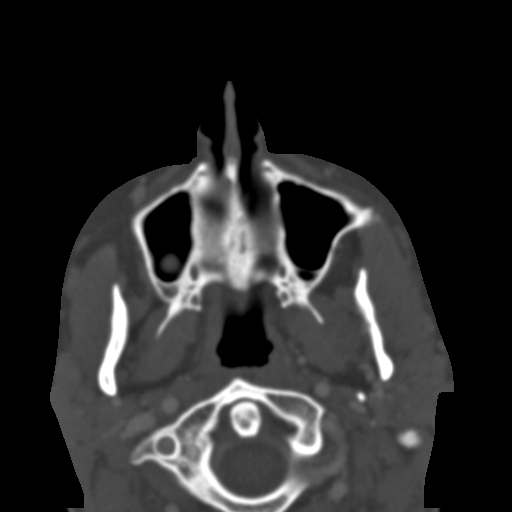
[im 49/83  bone]
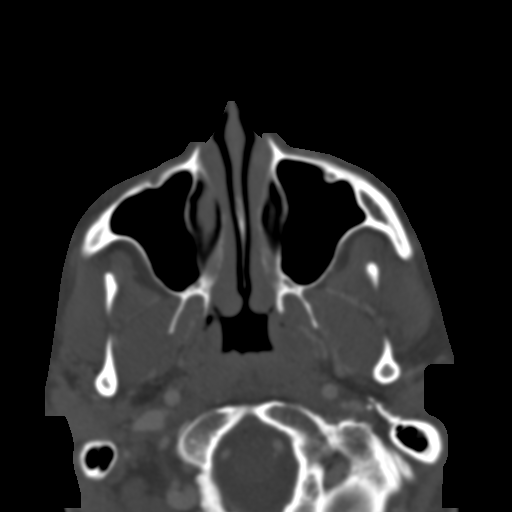
[im 54/83  bone]
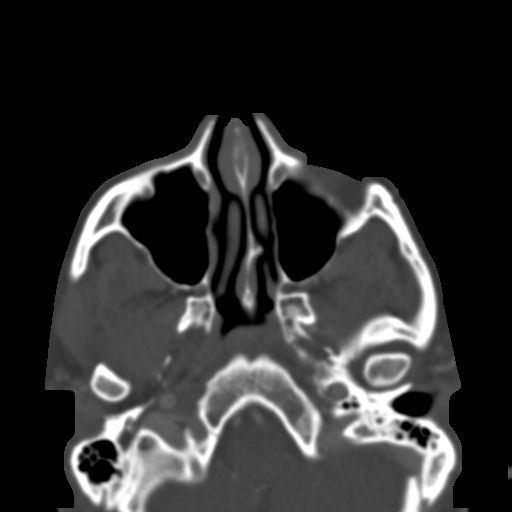
[im 60/83  bone]
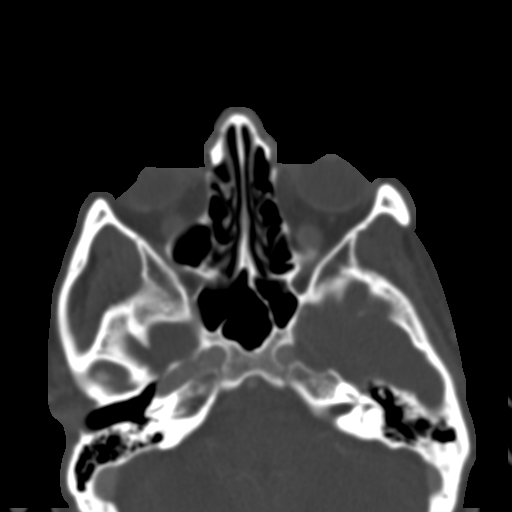
[im 63/83  brain]
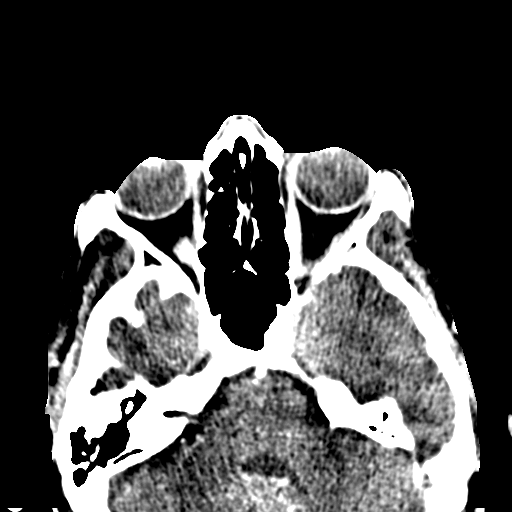
[im 63/83  bone]
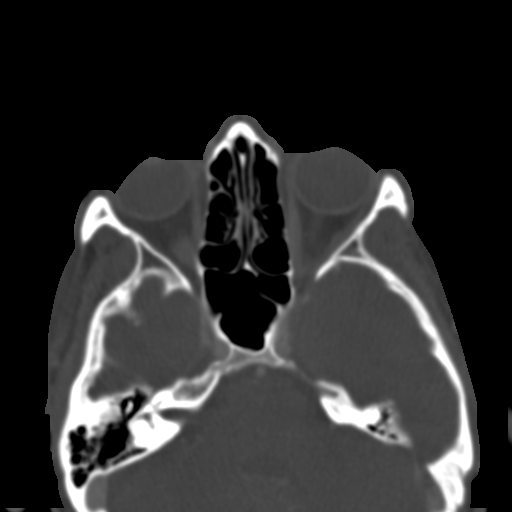
[im 68/83  bone]
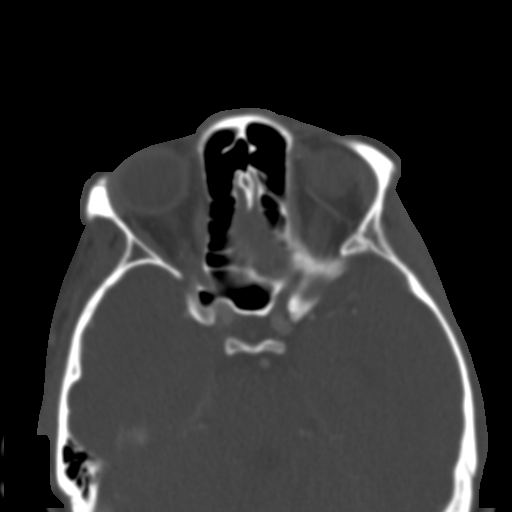
[im 74/83  bone]
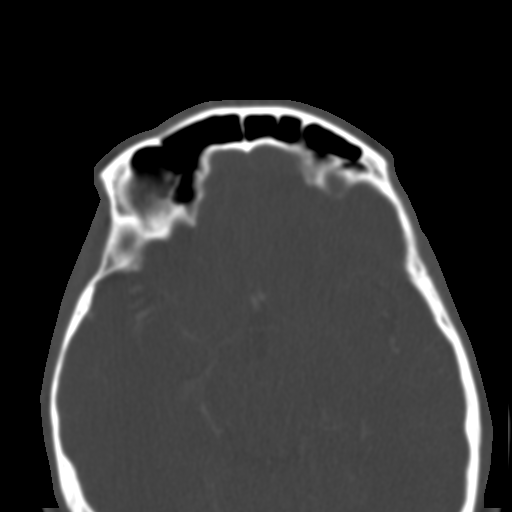
[im 80/83  bone]
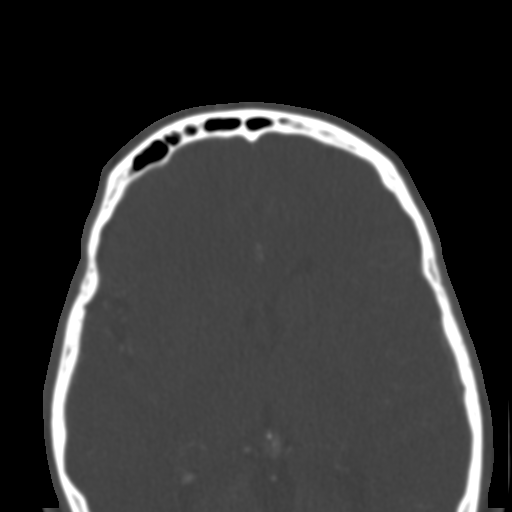

[16 of 30 positions shown; findings below may reference images not displayed]

FINDINGS: Considerable soft tissue swelling is noted in the right face along
the cheek and mandible. No focal fluid collection is identified to
suggest focal abscess formation. The orbits and their contents are
within normal limits. No other focal abnormality is seen. The
visualized intracranial structures are unremarkable.
IMPRESSION: Soft tissue swelling in the right cheek and jaw without focal
abscess formation. These changes would be of most consistent with
cellulitis.

No bony abnormality is seen.

## 2016-05-31 IMAGING — CR DG CHEST 2V
2 series · 2 of 2 positions shown · non-contrast
Comparison: PA and lateral chest 03/01/2013.

CLINICAL DATA: Chest pain and shortness of breath for 1 day.

EXAM:
CHEST  2 VIEW

[w chest pa]
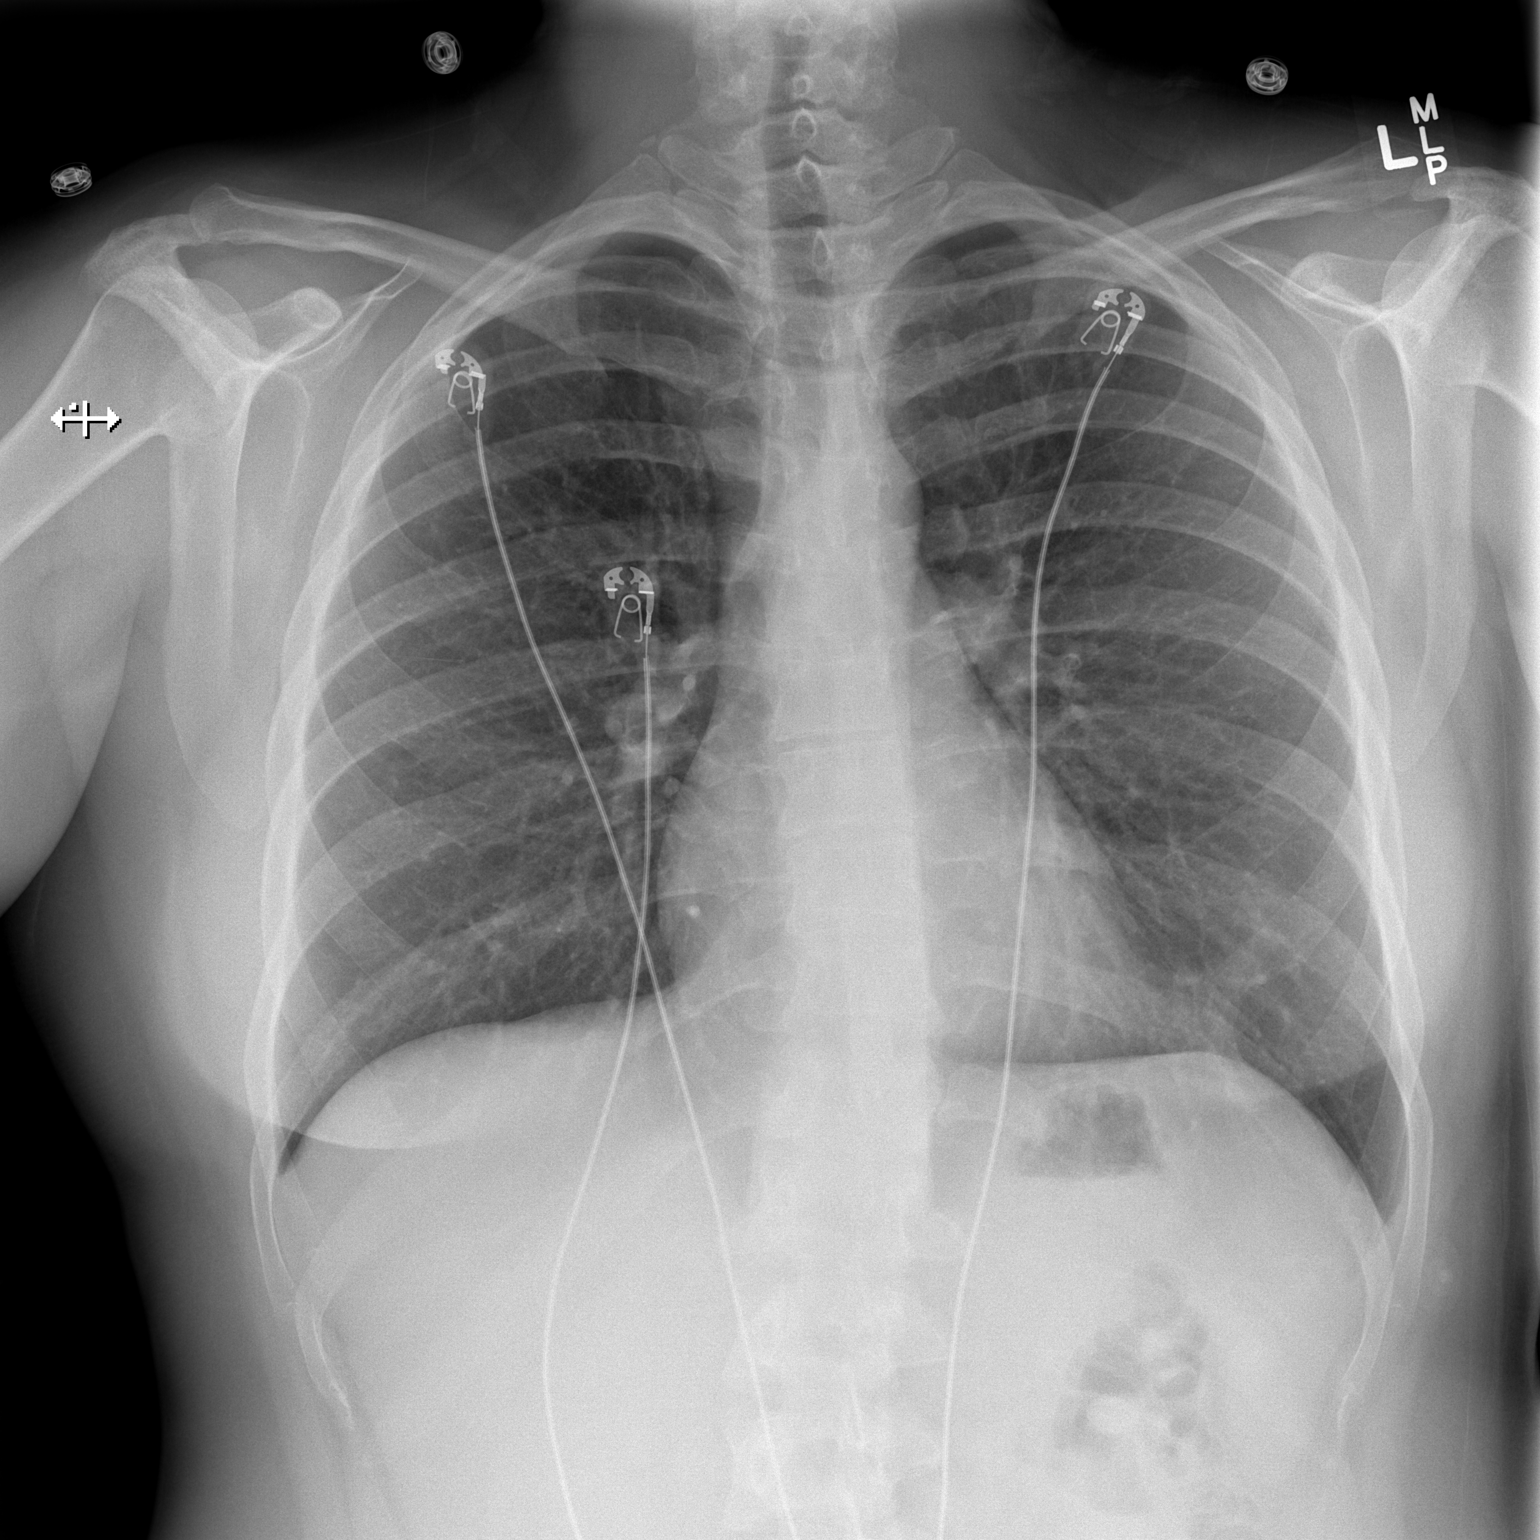

[w chest lat]
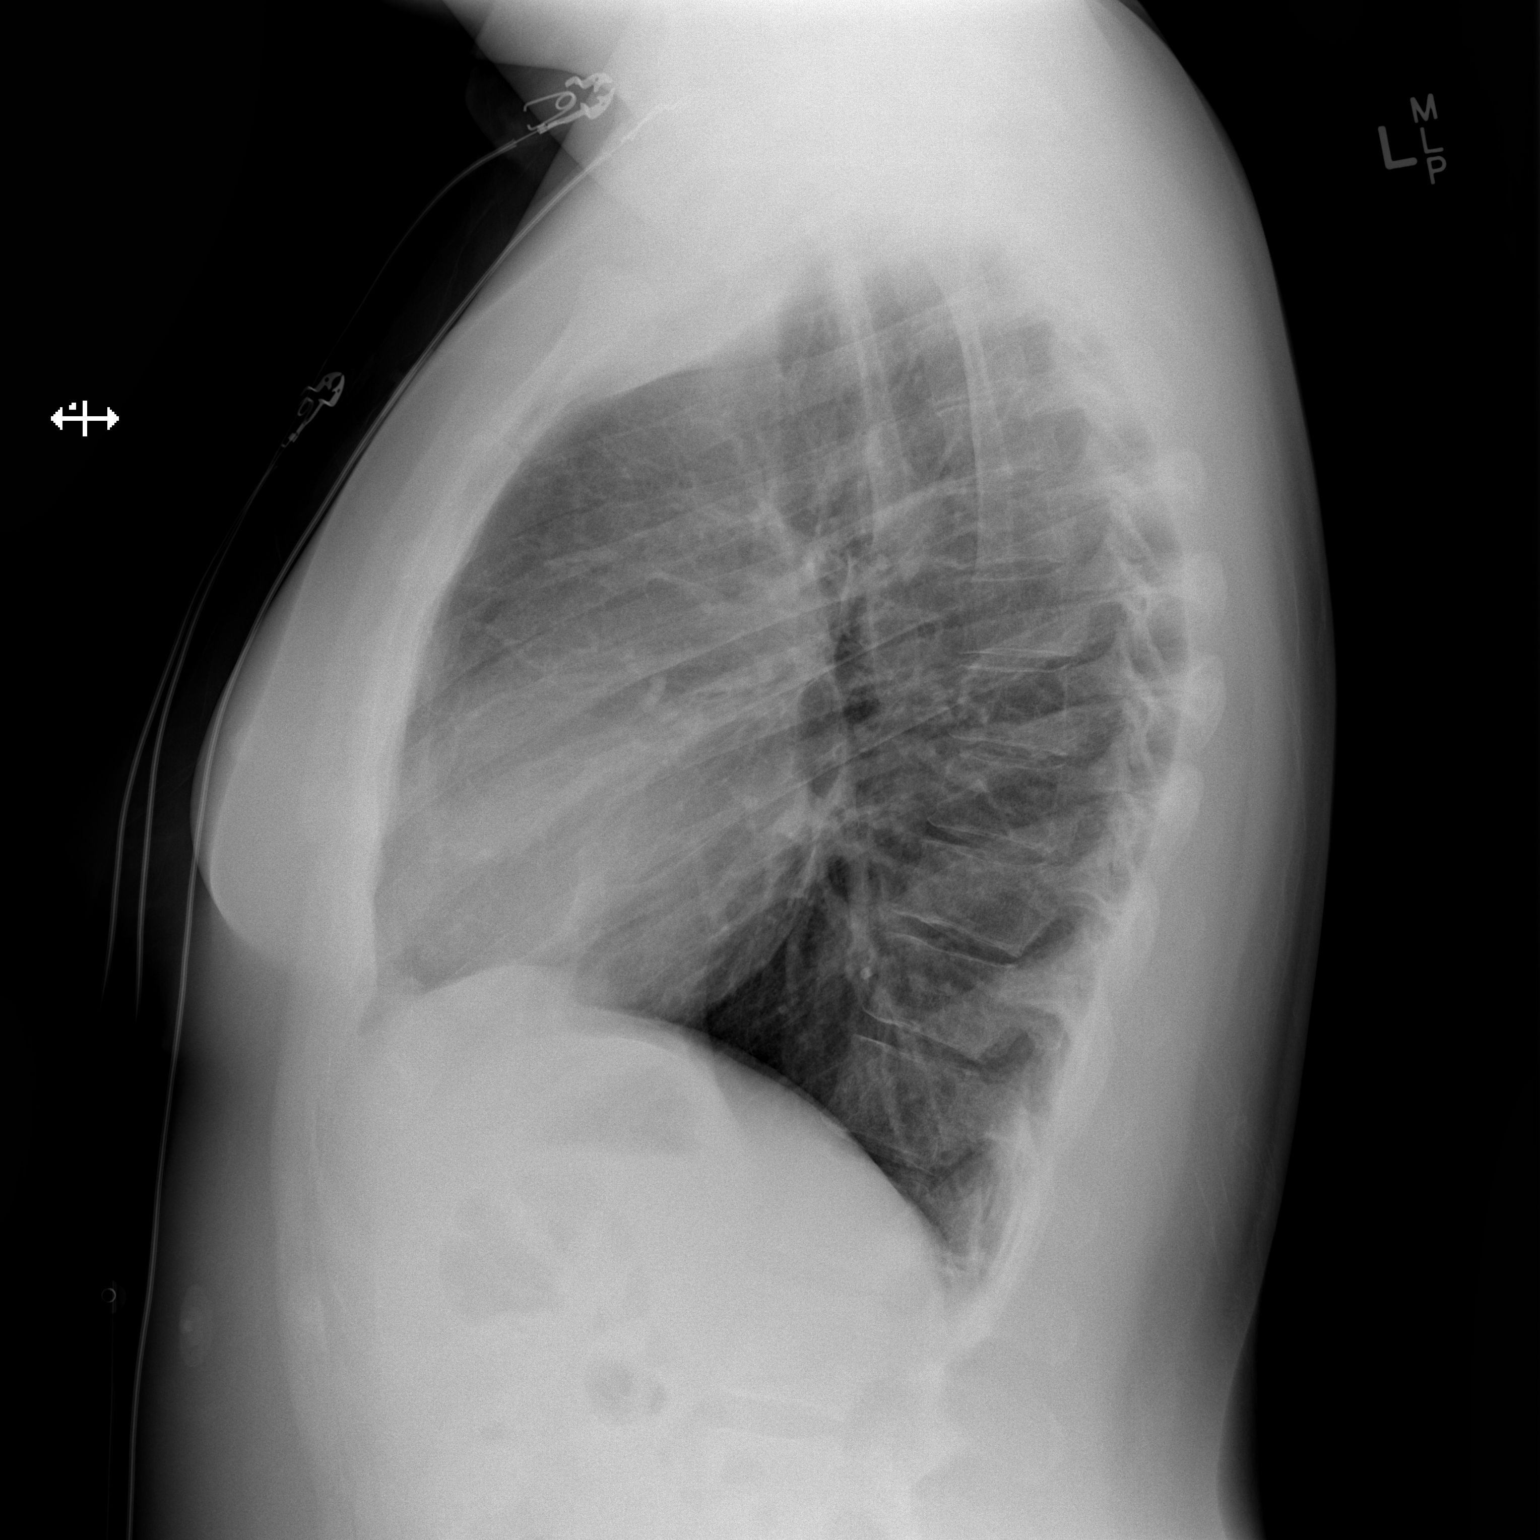

[2 of 2 positions shown; findings below may reference images not displayed]

FINDINGS: Heart size and mediastinal contours are within normal limits. Both
lungs are clear. Visualized skeletal structures are unremarkable.
IMPRESSION: Negative exam.
